# Patient Record
Sex: Female | Born: 1950 | Race: White | Hispanic: No | Marital: Single
Health system: Southern US, Community
[De-identification: ages and names within clinical notes are randomized; demographics above are authoritative.]

---

## 2005-08-04 ENCOUNTER — Ambulatory Visit: Payer: Self-pay | Admitting: Family Medicine

## 2005-10-08 ENCOUNTER — Emergency Department: Payer: Self-pay | Admitting: Emergency Medicine

## 2005-10-18 ENCOUNTER — Other Ambulatory Visit: Payer: Self-pay

## 2005-10-18 ENCOUNTER — Emergency Department: Payer: Self-pay | Admitting: Emergency Medicine

## 2005-12-13 ENCOUNTER — Emergency Department: Payer: Self-pay | Admitting: Emergency Medicine

## 2006-01-30 ENCOUNTER — Emergency Department: Payer: Self-pay | Admitting: Emergency Medicine

## 2006-01-30 ENCOUNTER — Other Ambulatory Visit: Payer: Self-pay

## 2006-02-22 ENCOUNTER — Emergency Department: Payer: Self-pay | Admitting: Emergency Medicine

## 2006-02-22 ENCOUNTER — Other Ambulatory Visit: Payer: Self-pay

## 2006-02-24 ENCOUNTER — Emergency Department: Payer: Self-pay | Admitting: Internal Medicine

## 2006-04-10 ENCOUNTER — Emergency Department: Payer: Self-pay | Admitting: Emergency Medicine

## 2006-04-10 ENCOUNTER — Other Ambulatory Visit: Payer: Self-pay

## 2006-04-15 ENCOUNTER — Ambulatory Visit: Payer: Self-pay

## 2006-04-25 ENCOUNTER — Emergency Department: Payer: Self-pay

## 2006-04-25 ENCOUNTER — Other Ambulatory Visit: Payer: Self-pay

## 2006-05-08 ENCOUNTER — Ambulatory Visit: Payer: Self-pay | Admitting: General Surgery

## 2006-07-09 ENCOUNTER — Ambulatory Visit: Payer: Self-pay | Admitting: Oncology

## 2006-08-09 ENCOUNTER — Other Ambulatory Visit: Payer: Self-pay

## 2006-08-09 ENCOUNTER — Emergency Department: Payer: Self-pay | Admitting: Emergency Medicine

## 2006-08-11 IMAGING — CR DG CHEST 1V PORT
1 series · 1 of 1 positions shown · non-contrast
Comparison: none

REASON FOR EXAM: chest pain
COMMENTS:

PROCEDURE:     DXR - DXR PORTABLE CHEST SINGLE VIEW  - April 10, 2006  [DATE]
RESULT:          Comparison is made to a prior study dated 10/18/2005.
The lungs are clear.  The cardiac silhouette and visualized bony skeleton
are unremarkable.

[view not recorded]
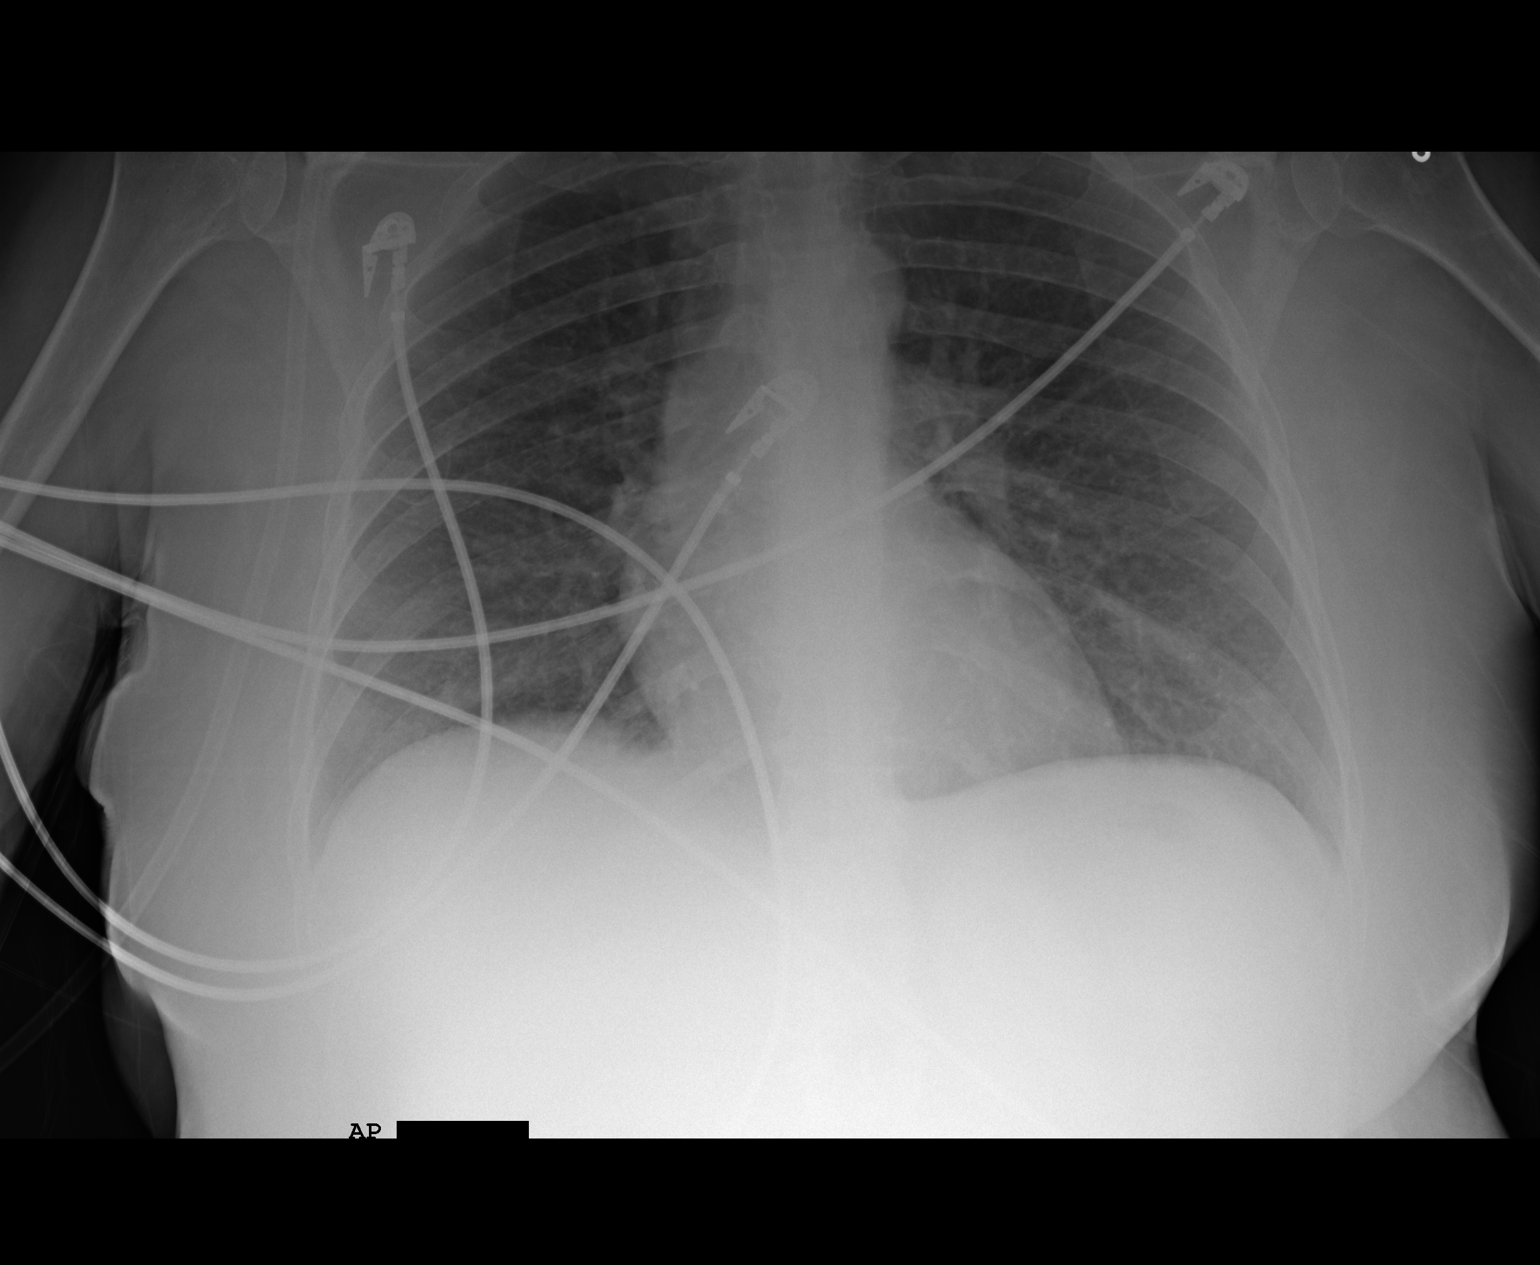

[1 of 1 positions shown; findings below may reference images not displayed]

IMPRESSION: Chest radiograph without evidence of acute cardiopulmonary
disease.

## 2006-08-21 ENCOUNTER — Emergency Department: Payer: Self-pay

## 2006-08-23 ENCOUNTER — Emergency Department: Payer: Self-pay | Admitting: Internal Medicine

## 2006-08-23 ENCOUNTER — Other Ambulatory Visit: Payer: Self-pay

## 2006-09-03 ENCOUNTER — Emergency Department: Payer: Self-pay | Admitting: Emergency Medicine

## 2006-11-11 ENCOUNTER — Ambulatory Visit: Payer: Self-pay

## 2007-03-20 ENCOUNTER — Emergency Department: Payer: Self-pay | Admitting: Unknown Physician Specialty

## 2007-06-07 ENCOUNTER — Ambulatory Visit: Payer: Self-pay | Admitting: General Surgery

## 2007-06-14 ENCOUNTER — Ambulatory Visit: Payer: Self-pay | Admitting: Pain Medicine

## 2007-06-22 ENCOUNTER — Ambulatory Visit: Payer: Self-pay | Admitting: Pain Medicine

## 2007-06-29 ENCOUNTER — Ambulatory Visit: Payer: Self-pay | Admitting: Pain Medicine

## 2007-07-21 IMAGING — CR DG CHEST 1V PORT
1 series · 1 of 1 positions shown · non-contrast
Comparison: none

REASON FOR EXAM: Shortness of breath
COMMENTS:

[view not recorded]
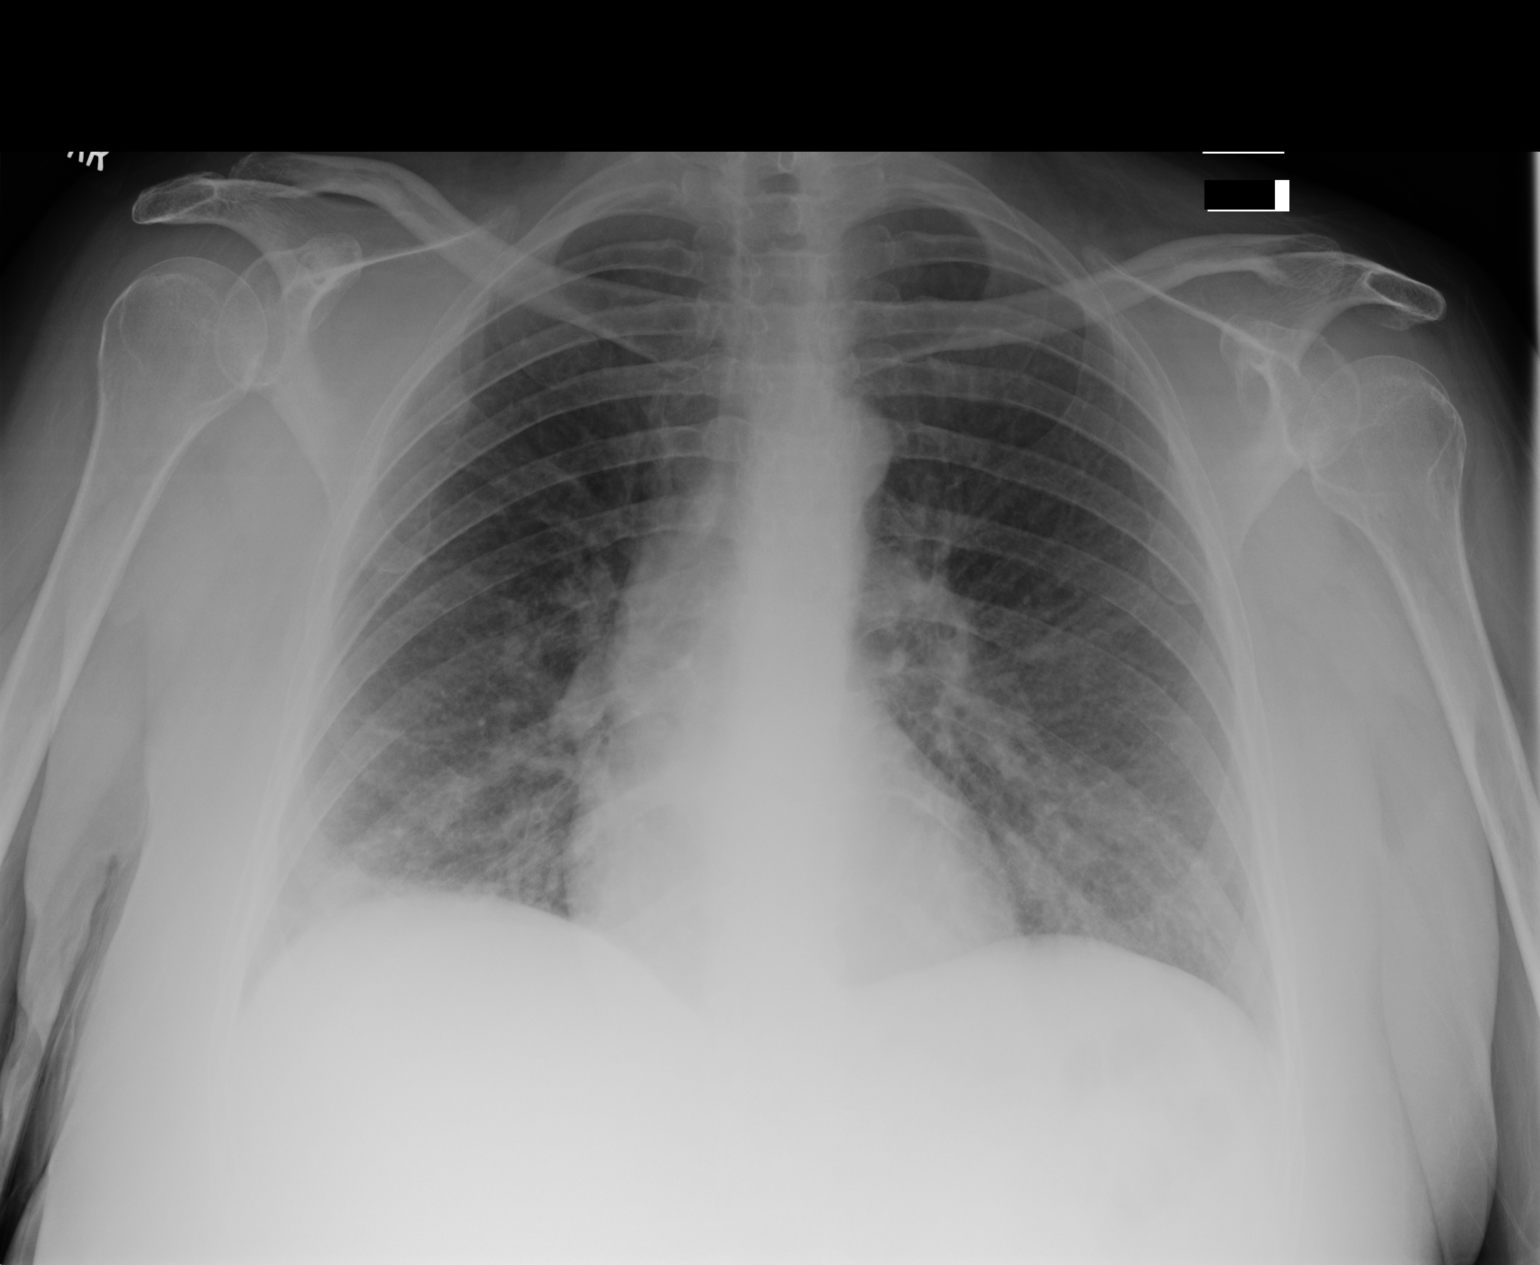

[1 of 1 positions shown; findings below may reference images not displayed]

PROCEDURE:     DXR - DXR PORTABLE CHEST SINGLE VIEW  - March 20, 2007  [DATE]

RESULT:     Frontal view of the chest is performed. Comparison is made to a
prior study dated 08/23/2006.

There is thickening of the interstitial markings as well as an element of
peribronchial cuffing. Areas of increased density project within the RIGHT
and LEFT lung bases. The cardiac silhouette is within normal limits. The
visualized bony skeleton demonstrates no evidence of fracture or
dislocation.
IMPRESSION: 1.     Bibasilar atelectasis versus infiltrate versus an element of
asymmetric edema.
2.     Findings which may represent the sequela of a diffuse interstitial
infiltrate, edematous versus non-edematous. Repeat surveillance evaluation
is recommended.

## 2007-10-05 ENCOUNTER — Emergency Department: Payer: Self-pay | Admitting: Emergency Medicine

## 2007-11-09 ENCOUNTER — Ambulatory Visit: Payer: Self-pay | Admitting: Internal Medicine

## 2008-09-11 ENCOUNTER — Ambulatory Visit: Payer: Self-pay | Admitting: Gastroenterology

## 2008-12-08 ENCOUNTER — Ambulatory Visit: Payer: Self-pay | Admitting: Internal Medicine

## 2010-02-05 ENCOUNTER — Emergency Department: Payer: Self-pay | Admitting: Internal Medicine

## 2010-02-25 ENCOUNTER — Emergency Department: Payer: Self-pay | Admitting: Emergency Medicine

## 2010-04-18 ENCOUNTER — Ambulatory Visit: Payer: Self-pay | Admitting: Urology

## 2010-05-14 ENCOUNTER — Ambulatory Visit: Payer: Self-pay | Admitting: Urology

## 2010-11-03 ENCOUNTER — Inpatient Hospital Stay: Payer: Self-pay | Admitting: Internal Medicine

## 2011-05-06 ENCOUNTER — Ambulatory Visit: Payer: Self-pay | Admitting: Emergency Medicine

## 2011-05-09 LAB — PATHOLOGY REPORT

## 2011-08-19 ENCOUNTER — Ambulatory Visit: Payer: Self-pay | Admitting: Emergency Medicine

## 2011-09-01 ENCOUNTER — Inpatient Hospital Stay: Payer: Self-pay | Admitting: Internal Medicine

## 2011-10-12 ENCOUNTER — Inpatient Hospital Stay: Payer: Self-pay | Admitting: Internal Medicine

## 2011-10-12 LAB — CK TOTAL AND CKMB (NOT AT ARMC)
CK, Total: 102 U/L (ref 21–215)
CK, Total: 100 U/L (ref 21–215)

## 2011-10-12 LAB — COMPREHENSIVE METABOLIC PANEL
Albumin: 2.8 g/dL — ABNORMAL LOW (ref 3.4–5.0)
Alkaline Phosphatase: 192 U/L — ABNORMAL HIGH (ref 50–136)
BUN: 26 mg/dL — ABNORMAL HIGH (ref 7–18)
Bilirubin,Total: 0.3 mg/dL (ref 0.2–1.0)
SGPT (ALT): 22 U/L
Total Protein: 7.1 g/dL (ref 6.4–8.2)

## 2011-10-12 LAB — CBC
HCT: 28.9 % — ABNORMAL LOW (ref 35.0–47.0)
HGB: 9.4 g/dL — ABNORMAL LOW (ref 12.0–16.0)
MCH: 29.4 pg (ref 26.0–34.0)
MCHC: 32.3 g/dL (ref 32.0–36.0)
MCV: 91 fL (ref 80–100)

## 2011-10-12 LAB — IRON AND TIBC
Iron Bind.Cap.(Total): 259 ug/dL (ref 250–450)
Iron Saturation: 11 %
Iron: 29 ug/dL — ABNORMAL LOW (ref 50–170)

## 2011-10-12 LAB — FERRITIN: Ferritin (ARMC): 1236 ng/mL — ABNORMAL HIGH (ref 8–388)

## 2011-10-12 LAB — RAPID INFLUENZA A&B ANTIGENS

## 2011-10-12 LAB — TROPONIN I: Troponin-I: 0.08 ng/mL — ABNORMAL HIGH

## 2011-10-13 LAB — URINALYSIS, COMPLETE
Glucose,UR: 150 mg/dL (ref 0–75)
Hyaline Cast: 27
Ketone: NEGATIVE
Nitrite: NEGATIVE
Protein: 100
RBC,UR: 23 /HPF (ref 0–5)
Specific Gravity: 1.014 (ref 1.003–1.030)
Squamous Epithelial: 2
WBC UR: 21 /HPF (ref 0–5)

## 2011-10-13 LAB — CBC WITH DIFFERENTIAL/PLATELET
Basophil %: 0.3 %
Eosinophil #: 0 10*3/uL (ref 0.0–0.7)
Eosinophil %: 0 %
HCT: 25.7 % — ABNORMAL LOW (ref 35.0–47.0)
HGB: 8.3 g/dL — ABNORMAL LOW (ref 12.0–16.0)
Lymphocyte #: 1 10*3/uL (ref 1.0–3.6)
Lymphocyte %: 14.2 %
MCH: 29.4 pg (ref 26.0–34.0)
MCHC: 32.4 g/dL (ref 32.0–36.0)
MCV: 91 fL (ref 80–100)
Monocyte #: 0.2 10*3/uL (ref 0.0–0.7)
Monocyte %: 2.7 %
Neutrophil #: 5.7 10*3/uL (ref 1.4–6.5)
Neutrophil %: 82.8 %
RBC: 2.83 10*6/uL — ABNORMAL LOW (ref 3.80–5.20)
RDW: 16.1 % — ABNORMAL HIGH (ref 11.5–14.5)

## 2011-10-13 LAB — LIPID PANEL
HDL Cholesterol: 31 mg/dL — ABNORMAL LOW (ref 40–60)
VLDL Cholesterol, Calc: 22 mg/dL (ref 5–40)

## 2011-10-13 LAB — CK TOTAL AND CKMB (NOT AT ARMC): CK-MB: 0.9 ng/mL (ref 0.5–3.6)

## 2011-10-13 LAB — CREATININE, SERUM
Creatinine: 2.18 mg/dL — ABNORMAL HIGH (ref 0.60–1.30)
EGFR (African American): 30 — ABNORMAL LOW

## 2011-10-13 LAB — MAGNESIUM: Magnesium: 1.7 mg/dL — ABNORMAL LOW

## 2011-10-13 LAB — TROPONIN I: Troponin-I: 0.09 ng/mL — ABNORMAL HIGH

## 2011-10-14 LAB — BASIC METABOLIC PANEL
Chloride: 93 mmol/L — ABNORMAL LOW (ref 98–107)
Co2: 31 mmol/L (ref 21–32)
Creatinine: 2.67 mg/dL — ABNORMAL HIGH (ref 0.60–1.30)
EGFR (Non-African Amer.): 19 — ABNORMAL LOW
Glucose: 343 mg/dL — ABNORMAL HIGH (ref 65–99)
Osmolality: 290 (ref 275–301)
Potassium: 5.3 mmol/L — ABNORMAL HIGH (ref 3.5–5.1)
Sodium: 133 mmol/L — ABNORMAL LOW (ref 136–145)

## 2011-10-14 LAB — CBC WITH DIFFERENTIAL/PLATELET
Basophil #: 0 10*3/uL (ref 0.0–0.1)
Basophil %: 0.3 %
Eosinophil %: 0.1 %
HCT: 25.8 % — ABNORMAL LOW (ref 35.0–47.0)
HGB: 8.4 g/dL — ABNORMAL LOW (ref 12.0–16.0)
Lymphocyte #: 1 10*3/uL (ref 1.0–3.6)
Lymphocyte %: 15.9 %
MCHC: 32.7 g/dL (ref 32.0–36.0)
MCV: 91 fL (ref 80–100)
Monocyte #: 0.2 10*3/uL (ref 0.0–0.7)
Monocyte %: 4.1 %
Neutrophil #: 4.8 10*3/uL (ref 1.4–6.5)
RBC: 2.84 10*6/uL — ABNORMAL LOW (ref 3.80–5.20)
RDW: 15.7 % — ABNORMAL HIGH (ref 11.5–14.5)
WBC: 6 10*3/uL (ref 3.6–11.0)

## 2011-10-14 LAB — MAGNESIUM: Magnesium: 2.3 mg/dL

## 2011-10-15 LAB — BASIC METABOLIC PANEL
Anion Gap: 10 (ref 7–16)
BUN: 53 mg/dL — ABNORMAL HIGH (ref 7–18)
Co2: 31 mmol/L (ref 21–32)
EGFR (African American): 24 — ABNORMAL LOW
Glucose: 117 mg/dL — ABNORMAL HIGH (ref 65–99)
Osmolality: 291 (ref 275–301)
Sodium: 138 mmol/L (ref 136–145)

## 2011-10-15 LAB — CBC WITH DIFFERENTIAL/PLATELET
Basophil #: 0 10*3/uL (ref 0.0–0.1)
Basophil %: 0.3 %
Eosinophil #: 0 10*3/uL (ref 0.0–0.7)
Eosinophil %: 0 %
HCT: 25.7 % — ABNORMAL LOW (ref 35.0–47.0)
HGB: 8.3 g/dL — ABNORMAL LOW (ref 12.0–16.0)
Lymphocyte %: 18.7 %
MCH: 29.4 pg (ref 26.0–34.0)
MCHC: 32.5 g/dL (ref 32.0–36.0)
MCV: 91 fL (ref 80–100)
RBC: 2.83 10*6/uL — ABNORMAL LOW (ref 3.80–5.20)
WBC: 7.3 10*3/uL (ref 3.6–11.0)

## 2011-10-16 LAB — CBC WITH DIFFERENTIAL/PLATELET
Basophil #: 0 10*3/uL (ref 0.0–0.1)
Basophil %: 0.2 %
Eosinophil %: 0 %
HCT: 26 % — ABNORMAL LOW (ref 35.0–47.0)
HGB: 8.5 g/dL — ABNORMAL LOW (ref 12.0–16.0)
Lymphocyte #: 0.8 10*3/uL — ABNORMAL LOW (ref 1.0–3.6)
MCHC: 32.6 g/dL (ref 32.0–36.0)
MCV: 90 fL (ref 80–100)
Monocyte #: 0.4 10*3/uL (ref 0.0–0.7)
Monocyte %: 4.8 %
Neutrophil #: 7.7 10*3/uL — ABNORMAL HIGH (ref 1.4–6.5)
Platelet: 177 10*3/uL (ref 150–440)
RBC: 2.87 10*6/uL — ABNORMAL LOW (ref 3.80–5.20)
WBC: 8.9 10*3/uL (ref 3.6–11.0)

## 2011-10-16 LAB — BASIC METABOLIC PANEL
BUN: 66 mg/dL — ABNORMAL HIGH (ref 7–18)
Calcium, Total: 8.1 mg/dL — ABNORMAL LOW (ref 8.5–10.1)
Co2: 31 mmol/L (ref 21–32)
EGFR (African American): 22 — ABNORMAL LOW
Osmolality: 284 (ref 275–301)
Sodium: 132 mmol/L — ABNORMAL LOW (ref 136–145)

## 2011-10-17 LAB — CBC WITH DIFFERENTIAL/PLATELET
Basophil #: 0 10*3/uL (ref 0.0–0.1)
Eosinophil #: 0 10*3/uL (ref 0.0–0.7)
HCT: 27.9 % — ABNORMAL LOW (ref 35.0–47.0)
Lymphocyte #: 1 10*3/uL (ref 1.0–3.6)
MCHC: 32.6 g/dL (ref 32.0–36.0)
MCV: 90 fL (ref 80–100)
Monocyte #: 0.2 10*3/uL (ref 0.0–0.7)
Monocyte %: 2.7 %
Neutrophil #: 7.3 10*3/uL — ABNORMAL HIGH (ref 1.4–6.5)
Platelet: 182 10*3/uL (ref 150–440)
RDW: 15.5 % — ABNORMAL HIGH (ref 11.5–14.5)
WBC: 8.5 10*3/uL (ref 3.6–11.0)

## 2011-10-17 LAB — BASIC METABOLIC PANEL
Anion Gap: 9 (ref 7–16)
BUN: 76 mg/dL — ABNORMAL HIGH (ref 7–18)
Chloride: 94 mmol/L — ABNORMAL LOW (ref 98–107)
Co2: 30 mmol/L (ref 21–32)
Creatinine: 2.89 mg/dL — ABNORMAL HIGH (ref 0.60–1.30)
Osmolality: 295 (ref 275–301)
Potassium: 5.4 mmol/L — ABNORMAL HIGH (ref 3.5–5.1)
Sodium: 133 mmol/L — ABNORMAL LOW (ref 136–145)

## 2011-10-18 LAB — BASIC METABOLIC PANEL
Anion Gap: 8 (ref 7–16)
BUN: 82 mg/dL — ABNORMAL HIGH (ref 7–18)
Calcium, Total: 8.2 mg/dL — ABNORMAL LOW (ref 8.5–10.1)
Chloride: 95 mmol/L — ABNORMAL LOW (ref 98–107)
Glucose: 160 mg/dL — ABNORMAL HIGH (ref 65–99)
Osmolality: 295 (ref 275–301)

## 2011-10-18 LAB — CBC WITH DIFFERENTIAL/PLATELET
Basophil #: 0 10*3/uL (ref 0.0–0.1)
Eosinophil #: 0 10*3/uL (ref 0.0–0.7)
HGB: 9.2 g/dL — ABNORMAL LOW (ref 12.0–16.0)
Lymphocyte %: 13.7 %
MCH: 29.3 pg (ref 26.0–34.0)
MCHC: 32.4 g/dL (ref 32.0–36.0)
MCV: 90 fL (ref 80–100)
Neutrophil #: 7.2 10*3/uL — ABNORMAL HIGH (ref 1.4–6.5)
Neutrophil %: 82.3 %
Platelet: 184 10*3/uL (ref 150–440)
RBC: 3.13 10*6/uL — ABNORMAL LOW (ref 3.80–5.20)
RDW: 15.3 % — ABNORMAL HIGH (ref 11.5–14.5)

## 2011-10-18 LAB — CULTURE, BLOOD (SINGLE)

## 2011-10-18 LAB — PROTIME-INR: Prothrombin Time: 14.4 secs (ref 11.5–14.7)

## 2011-10-18 LAB — VANCOMYCIN, TROUGH: Vancomycin, Trough: 23 ug/mL (ref 10–20)

## 2011-10-19 LAB — BASIC METABOLIC PANEL
Anion Gap: 9 (ref 7–16)
Calcium, Total: 8.4 mg/dL — ABNORMAL LOW (ref 8.5–10.1)
Chloride: 102 mmol/L (ref 98–107)
Co2: 30 mmol/L (ref 21–32)
Creatinine: 2.41 mg/dL — ABNORMAL HIGH (ref 0.60–1.30)
EGFR (African American): 27 — ABNORMAL LOW
Potassium: 5.4 mmol/L — ABNORMAL HIGH (ref 3.5–5.1)
Sodium: 141 mmol/L (ref 136–145)

## 2011-10-19 LAB — VANCOMYCIN, TROUGH: Vancomycin, Trough: 21 ug/mL (ref 10–20)

## 2011-10-20 ENCOUNTER — Inpatient Hospital Stay
Admission: AD | Admit: 2011-10-20 | Discharge: 2011-11-17 | Disposition: A | Discharge status: Skilled Nursing Facility | Payer: Self-pay | Source: Ambulatory Visit | Attending: Internal Medicine | Admitting: Internal Medicine

## 2011-10-20 LAB — BASIC METABOLIC PANEL
Calcium, Total: 8.7 mg/dL (ref 8.5–10.1)
Creatinine: 1.98 mg/dL — ABNORMAL HIGH (ref 0.60–1.30)
EGFR (African American): 33 — ABNORMAL LOW
EGFR (Non-African Amer.): 28 — ABNORMAL LOW
Glucose: 135 mg/dL — ABNORMAL HIGH (ref 65–99)
Osmolality: 308 (ref 275–301)
Potassium: 6 mmol/L — ABNORMAL HIGH (ref 3.5–5.1)
Sodium: 143 mmol/L (ref 136–145)

## 2011-10-21 ENCOUNTER — Other Ambulatory Visit (HOSPITAL_COMMUNITY): Payer: Self-pay

## 2011-10-21 ENCOUNTER — Other Ambulatory Visit: Payer: Self-pay

## 2011-10-21 LAB — PHOSPHORUS: Phosphorus: 3.6 mg/dL (ref 2.3–4.6)

## 2011-10-21 LAB — PRO B NATRIURETIC PEPTIDE: Pro B Natriuretic peptide (BNP): 8486 pg/mL — ABNORMAL HIGH (ref 0–125)

## 2011-10-21 LAB — DIFFERENTIAL
Basophils Absolute: 0 10*3/uL (ref 0.0–0.1)
Basophils Relative: 0 % (ref 0–1)
Eosinophils Relative: 0 % (ref 0–5)
Monocytes Absolute: 0.5 10*3/uL (ref 0.1–1.0)
Neutro Abs: 7.7 10*3/uL (ref 1.7–7.7)

## 2011-10-21 LAB — MAGNESIUM: Magnesium: 2.6 mg/dL — ABNORMAL HIGH (ref 1.5–2.5)

## 2011-10-24 ENCOUNTER — Other Ambulatory Visit (HOSPITAL_COMMUNITY): Payer: Self-pay

## 2011-10-25 LAB — ALBUMIN: Albumin: 2.4 g/dL — ABNORMAL LOW (ref 3.5–5.2)

## 2011-10-25 LAB — DIFFERENTIAL
Lymphocytes Relative: 22 % (ref 12–46)
Lymphs Abs: 2.7 10*3/uL (ref 0.7–4.0)
Monocytes Absolute: 0.9 10*3/uL (ref 0.1–1.0)
Monocytes Relative: 8 % (ref 3–12)
Neutro Abs: 8.3 10*3/uL — ABNORMAL HIGH (ref 1.7–7.7)

## 2011-10-25 LAB — GLUCOSE, RANDOM: Glucose, Bld: 436 mg/dL — ABNORMAL HIGH (ref 70–99)

## 2011-10-26 ENCOUNTER — Other Ambulatory Visit (HOSPITAL_COMMUNITY): Payer: Self-pay

## 2011-10-26 LAB — POTASSIUM
Potassium: 6.3 mEq/L (ref 3.5–5.1)
Potassium: 5.7 mEq/L — ABNORMAL HIGH (ref 3.5–5.1)

## 2011-10-27 ENCOUNTER — Other Ambulatory Visit (HOSPITAL_COMMUNITY): Payer: Self-pay

## 2011-10-27 LAB — BLOOD GAS, ARTERIAL
Bicarbonate: 30.6 mEq/L — ABNORMAL HIGH (ref 20.0–24.0)
FIO2: 0.35 %
O2 Saturation: 95.1 %
Patient temperature: 98.6
TCO2: 32.4 mmol/L (ref 0–100)
pCO2 arterial: 59 mmHg (ref 35.0–45.0)
pH, Arterial: 7.334 — ABNORMAL LOW (ref 7.350–7.400)
pO2, Arterial: 73.4 mmHg — ABNORMAL LOW (ref 80.0–100.0)

## 2011-10-27 LAB — BASIC METABOLIC PANEL
BUN: 57 mg/dL — ABNORMAL HIGH (ref 6–23)
CO2: 31 mEq/L (ref 19–32)
Chloride: 103 mEq/L (ref 96–112)
Chloride: 101 mEq/L (ref 96–112)
Creatinine, Ser: 1.33 mg/dL — ABNORMAL HIGH (ref 0.50–1.10)
Creatinine, Ser: 1.37 mg/dL — ABNORMAL HIGH (ref 0.50–1.10)
GFR calc Af Amer: 64 mL/min — ABNORMAL LOW (ref >90)
GFR calc non Af Amer: 55 mL/min — ABNORMAL LOW (ref >90)
Potassium: 4.7 mEq/L (ref 3.5–5.1)

## 2011-10-27 LAB — COMPREHENSIVE METABOLIC PANEL
AST: 37 U/L (ref 0–37)
Albumin: 2.4 g/dL — ABNORMAL LOW (ref 3.5–5.2)
Calcium: 9.2 mg/dL (ref 8.4–10.5)
Creatinine, Ser: 1.84 mg/dL — ABNORMAL HIGH (ref 0.50–1.10)
Total Protein: 6.2 g/dL (ref 6.0–8.3)

## 2011-10-27 LAB — IRON AND TIBC
Iron: 89 ug/dL (ref 42–135)
UIBC: 177 ug/dL (ref 125–400)

## 2011-10-27 LAB — CBC
HCT: 29.4 % — ABNORMAL LOW (ref 36.0–46.0)
HCT: 27.6 % — ABNORMAL LOW (ref 36.0–46.0)
HCT: 26.9 % — ABNORMAL LOW (ref 36.0–46.0)
Hemoglobin: 8.7 g/dL — ABNORMAL LOW (ref 12.0–15.0)
MCHC: 31.3 g/dL (ref 30.0–36.0)
MCV: 91.3 fL (ref 78.0–100.0)
RBC: 3.02 MIL/uL — ABNORMAL LOW (ref 3.87–5.11)
RDW: 15 % (ref 11.5–15.5)
RDW: 15.7 % — ABNORMAL HIGH (ref 11.5–15.5)
WBC: 12 10*3/uL — ABNORMAL HIGH (ref 4.0–10.5)
WBC: 15.3 10*3/uL — ABNORMAL HIGH (ref 4.0–10.5)

## 2011-10-27 LAB — TSH: TSH: 9.03 u[IU]/mL — ABNORMAL HIGH (ref 0.350–4.500)

## 2011-10-27 LAB — HEMOGLOBIN A1C: Mean Plasma Glucose: 166 mg/dL — ABNORMAL HIGH (ref <117)

## 2011-10-27 MED ORDER — IOHEXOL 350 MG/ML SOLN
100.0000 mL | Freq: Once | INTRAVENOUS | Status: AC | PRN
  Administered 2011-10-27: 100 mL via INTRAVENOUS

## 2011-10-27 MED ORDER — IOHEXOL 300 MG/ML  SOLN
20.0000 mL | INTRAMUSCULAR | Status: AC
  Administered 2011-10-27: 20 mL via ORAL

## 2011-10-28 LAB — CBC
MCH: 29.2 pg (ref 26.0–34.0)
MCHC: 31.2 g/dL (ref 30.0–36.0)
MCV: 93.4 fL (ref 78.0–100.0)
Platelets: 137 10*3/uL — ABNORMAL LOW (ref 150–400)
RBC: 2.71 MIL/uL — ABNORMAL LOW (ref 3.87–5.11)
RDW: 15.9 % — ABNORMAL HIGH (ref 11.5–15.5)

## 2011-10-28 LAB — IRON AND TIBC
Iron: 39 ug/dL — ABNORMAL LOW (ref 42–135)
Saturation Ratios: 13 % — ABNORMAL LOW (ref 20–55)
TIBC: 302 ug/dL (ref 250–470)
UIBC: 263 ug/dL (ref 125–400)

## 2011-10-28 LAB — BASIC METABOLIC PANEL
BUN: 74 mg/dL — ABNORMAL HIGH (ref 6–23)
Chloride: 101 mEq/L (ref 96–112)
GFR calc Af Amer: 46 mL/min — ABNORMAL LOW (ref >90)
GFR calc non Af Amer: 40 mL/min — ABNORMAL LOW (ref >90)
Potassium: 4.3 mEq/L (ref 3.5–5.1)
Sodium: 141 mEq/L (ref 135–145)

## 2011-10-29 ENCOUNTER — Other Ambulatory Visit (HOSPITAL_COMMUNITY): Payer: Self-pay

## 2011-10-29 LAB — CBC
MCHC: 30.9 g/dL (ref 30.0–36.0)
Platelets: 128 10*3/uL — ABNORMAL LOW (ref 150–400)
RDW: 15.8 % — ABNORMAL HIGH (ref 11.5–15.5)
WBC: 12 10*3/uL — ABNORMAL HIGH (ref 4.0–10.5)

## 2011-10-30 LAB — TYPE AND SCREEN
ABO/RH(D): O NEG
Unit division: 0

## 2011-10-30 LAB — CBC
HCT: 31.1 % — ABNORMAL LOW (ref 36.0–46.0)
Hemoglobin: 10.2 g/dL — ABNORMAL LOW (ref 12.0–15.0)
MCH: 29.8 pg (ref 26.0–34.0)
MCHC: 32.8 g/dL (ref 30.0–36.0)
MCV: 90.9 fL (ref 78.0–100.0)

## 2011-10-30 LAB — BASIC METABOLIC PANEL
BUN: 87 mg/dL — ABNORMAL HIGH (ref 6–23)
Chloride: 98 mEq/L (ref 96–112)
Glucose, Bld: 139 mg/dL — ABNORMAL HIGH (ref 70–99)
Potassium: 5 mEq/L (ref 3.5–5.1)

## 2011-10-30 LAB — PREPARE FRESH FROZEN PLASMA: Unit division: 0

## 2011-10-31 LAB — BASIC METABOLIC PANEL
BUN: 84 mg/dL — ABNORMAL HIGH (ref 6–23)
Calcium: 9.2 mg/dL (ref 8.4–10.5)
Creatinine, Ser: 1.95 mg/dL — ABNORMAL HIGH (ref 0.50–1.10)
GFR calc non Af Amer: 27 mL/min — ABNORMAL LOW (ref >90)
Glucose, Bld: 233 mg/dL — ABNORMAL HIGH (ref 70–99)

## 2011-11-01 LAB — BASIC METABOLIC PANEL
CO2: 35 mEq/L — ABNORMAL HIGH (ref 19–32)
Calcium: 9.3 mg/dL (ref 8.4–10.5)
Creatinine, Ser: 1.89 mg/dL — ABNORMAL HIGH (ref 0.50–1.10)
Glucose, Bld: 233 mg/dL — ABNORMAL HIGH (ref 70–99)
Sodium: 140 mEq/L (ref 135–145)

## 2011-11-01 LAB — CBC
MCH: 29.1 pg (ref 26.0–34.0)
MCV: 91.7 fL (ref 78.0–100.0)
Platelets: 130 10*3/uL — ABNORMAL LOW (ref 150–400)
RBC: 3.37 MIL/uL — ABNORMAL LOW (ref 3.87–5.11)

## 2011-11-02 LAB — BASIC METABOLIC PANEL
CO2: 36 mEq/L — ABNORMAL HIGH (ref 19–32)
Calcium: 9.1 mg/dL (ref 8.4–10.5)
Creatinine, Ser: 1.91 mg/dL — ABNORMAL HIGH (ref 0.50–1.10)

## 2011-11-03 LAB — BASIC METABOLIC PANEL
BUN: 77 mg/dL — ABNORMAL HIGH (ref 6–23)
Calcium: 9.2 mg/dL (ref 8.4–10.5)
Creatinine, Ser: 1.67 mg/dL — ABNORMAL HIGH (ref 0.50–1.10)
GFR calc non Af Amer: 33 mL/min — ABNORMAL LOW (ref >90)
Glucose, Bld: 55 mg/dL — ABNORMAL LOW (ref 70–99)
Sodium: 139 mEq/L (ref 135–145)

## 2011-11-03 LAB — CBC
MCH: 29.6 pg (ref 26.0–34.0)
MCHC: 31.3 g/dL (ref 30.0–36.0)
Platelets: 122 10*3/uL — ABNORMAL LOW (ref 150–400)
RDW: 15.5 % (ref 11.5–15.5)

## 2011-11-03 LAB — PROTIME-INR: Prothrombin Time: 14.6 seconds (ref 11.6–15.2)

## 2011-11-04 LAB — BASIC METABOLIC PANEL
BUN: 65 mg/dL — ABNORMAL HIGH (ref 6–23)
Creatinine, Ser: 1.46 mg/dL — ABNORMAL HIGH (ref 0.50–1.10)
GFR calc Af Amer: 45 mL/min — ABNORMAL LOW (ref >90)
GFR calc non Af Amer: 39 mL/min — ABNORMAL LOW (ref >90)

## 2011-11-05 LAB — BASIC METABOLIC PANEL
BUN: 62 mg/dL — ABNORMAL HIGH (ref 6–23)
Calcium: 9.1 mg/dL (ref 8.4–10.5)
GFR calc Af Amer: 41 mL/min — ABNORMAL LOW (ref >90)
GFR calc non Af Amer: 36 mL/min — ABNORMAL LOW (ref >90)
Potassium: 5.1 mEq/L (ref 3.5–5.1)
Sodium: 141 mEq/L (ref 135–145)

## 2011-11-05 LAB — CBC
MCH: 29 pg (ref 26.0–34.0)
MCHC: 30.1 g/dL (ref 30.0–36.0)
RDW: 15.8 % — ABNORMAL HIGH (ref 11.5–15.5)

## 2011-11-08 ENCOUNTER — Other Ambulatory Visit (HOSPITAL_COMMUNITY): Payer: Self-pay

## 2011-11-08 LAB — CBC
HCT: 29.8 % — ABNORMAL LOW (ref 36.0–46.0)
MCH: 28.9 pg (ref 26.0–34.0)
MCHC: 31.9 g/dL (ref 30.0–36.0)
RDW: 15.2 % (ref 11.5–15.5)

## 2011-11-08 LAB — BLOOD GAS, ARTERIAL
O2 Saturation: 88.8 %
Patient temperature: 98.6
TCO2: 34.7 mmol/L (ref 0–100)
pH, Arterial: 7.361 (ref 7.350–7.400)

## 2011-11-09 LAB — BLOOD GAS, ARTERIAL
Acid-Base Excess: 7.8 mmol/L — ABNORMAL HIGH (ref 0.0–2.0)
Bicarbonate: 32.7 mEq/L — ABNORMAL HIGH (ref 20.0–24.0)
Inspiratory PAP: 10
O2 Saturation: 95 %
TCO2: 34.3 mmol/L (ref 0–100)
pCO2 arterial: 53.4 mmHg — ABNORMAL HIGH (ref 35.0–45.0)
pO2, Arterial: 70.4 mmHg — ABNORMAL LOW (ref 80.0–100.0)

## 2011-11-09 LAB — CBC
HCT: 27.9 % — ABNORMAL LOW (ref 36.0–46.0)
MCV: 91.8 fL (ref 78.0–100.0)
RBC: 3.04 MIL/uL — ABNORMAL LOW (ref 3.87–5.11)
WBC: 6.6 10*3/uL (ref 4.0–10.5)

## 2011-11-09 LAB — BASIC METABOLIC PANEL
BUN: 57 mg/dL — ABNORMAL HIGH (ref 6–23)
Calcium: 8.7 mg/dL (ref 8.4–10.5)
Creatinine, Ser: 1.8 mg/dL — ABNORMAL HIGH (ref 0.50–1.10)
GFR calc Af Amer: 35 mL/min — ABNORMAL LOW (ref >90)
GFR calc non Af Amer: 30 mL/min — ABNORMAL LOW (ref >90)
Glucose, Bld: 238 mg/dL — ABNORMAL HIGH (ref 70–99)
Potassium: 5.2 mEq/L — ABNORMAL HIGH (ref 3.5–5.1)

## 2011-11-10 LAB — COMPREHENSIVE METABOLIC PANEL
ALT: 29 U/L (ref 0–35)
CO2: 33 mEq/L — ABNORMAL HIGH (ref 19–32)
Calcium: 9.1 mg/dL (ref 8.4–10.5)
Chloride: 95 mEq/L — ABNORMAL LOW (ref 96–112)
Creatinine, Ser: 1.92 mg/dL — ABNORMAL HIGH (ref 0.50–1.10)
GFR calc Af Amer: 32 mL/min — ABNORMAL LOW (ref >90)
GFR calc non Af Amer: 28 mL/min — ABNORMAL LOW (ref >90)
Glucose, Bld: 442 mg/dL — ABNORMAL HIGH (ref 70–99)
Sodium: 135 mEq/L (ref 135–145)
Total Bilirubin: 0.2 mg/dL — ABNORMAL LOW (ref 0.3–1.2)

## 2011-11-10 LAB — VANCOMYCIN, RANDOM: Vancomycin Rm: 8.3 ug/mL

## 2011-11-11 ENCOUNTER — Other Ambulatory Visit (HOSPITAL_COMMUNITY): Payer: Self-pay

## 2011-11-11 LAB — BASIC METABOLIC PANEL
BUN: 65 mg/dL — ABNORMAL HIGH (ref 6–23)
CO2: 33 mEq/L — ABNORMAL HIGH (ref 19–32)
Calcium: 9.3 mg/dL (ref 8.4–10.5)
Chloride: 101 mEq/L (ref 96–112)
Creatinine, Ser: 1.77 mg/dL — ABNORMAL HIGH (ref 0.50–1.10)
Glucose, Bld: 199 mg/dL — ABNORMAL HIGH (ref 70–99)

## 2011-11-11 LAB — DIFFERENTIAL
Basophils Absolute: 0 10*3/uL (ref 0.0–0.1)
Basophils Relative: 0 % (ref 0–1)
Eosinophils Absolute: 0 10*3/uL (ref 0.0–0.7)
Eosinophils Relative: 0 % (ref 0–5)
Monocytes Absolute: 0.5 10*3/uL (ref 0.1–1.0)
Monocytes Relative: 7 % (ref 3–12)
Neutro Abs: 5.7 10*3/uL (ref 1.7–7.7)

## 2011-11-11 LAB — PROCALCITONIN: Procalcitonin: 0.98 ng/mL

## 2011-11-11 LAB — CBC
HCT: 27.9 % — ABNORMAL LOW (ref 36.0–46.0)
Hemoglobin: 8.9 g/dL — ABNORMAL LOW (ref 12.0–15.0)
MCH: 29.2 pg (ref 26.0–34.0)
MCHC: 31.9 g/dL (ref 30.0–36.0)
RDW: 15.3 % (ref 11.5–15.5)

## 2011-11-11 LAB — PRO B NATRIURETIC PEPTIDE: Pro B Natriuretic peptide (BNP): 7819 pg/mL — ABNORMAL HIGH (ref 0–125)

## 2011-11-11 NOTE — Progress Notes (Signed)
  Echocardiogram 2D Echocardiogram has been performed.  Jorje Guild [facility] 11/11/2011, 4:42 PM

## 2011-11-12 LAB — BASIC METABOLIC PANEL
BUN: 62 mg/dL — ABNORMAL HIGH (ref 6–23)
CO2: 34 mEq/L — ABNORMAL HIGH (ref 19–32)
Calcium: 9 mg/dL (ref 8.4–10.5)
Chloride: 97 mEq/L (ref 96–112)
Creatinine, Ser: 1.79 mg/dL — ABNORMAL HIGH (ref 0.50–1.10)
Glucose, Bld: 271 mg/dL — ABNORMAL HIGH (ref 70–99)

## 2011-11-13 LAB — BASIC METABOLIC PANEL
BUN: 61 mg/dL — ABNORMAL HIGH (ref 6–23)
CO2: 38 mEq/L — ABNORMAL HIGH (ref 19–32)
Calcium: 9.2 mg/dL (ref 8.4–10.5)
GFR calc non Af Amer: 32 mL/min — ABNORMAL LOW (ref >90)
Glucose, Bld: 228 mg/dL — ABNORMAL HIGH (ref 70–99)
Sodium: 142 mEq/L (ref 135–145)

## 2011-11-14 LAB — BASIC METABOLIC PANEL
BUN: 56 mg/dL — ABNORMAL HIGH (ref 6–23)
CO2: 37 mEq/L — ABNORMAL HIGH (ref 19–32)
Calcium: 9.6 mg/dL (ref 8.4–10.5)
Chloride: 95 mEq/L — ABNORMAL LOW (ref 96–112)
Creatinine, Ser: 1.68 mg/dL — ABNORMAL HIGH (ref 0.50–1.10)
GFR calc Af Amer: 38 mL/min — ABNORMAL LOW (ref >90)
GFR calc non Af Amer: 33 mL/min — ABNORMAL LOW (ref >90)
Glucose, Bld: 138 mg/dL — ABNORMAL HIGH (ref 70–99)
Potassium: 4.4 mEq/L (ref 3.5–5.1)
Sodium: 139 mEq/L (ref 135–145)

## 2011-11-15 LAB — CULTURE, BLOOD (ROUTINE X 2)
Culture  Setup Time: 201302171110
Culture: NO GROWTH

## 2011-12-22 DEATH — deceased

## 2012-02-13 IMAGING — CR DG CHEST 1V PORT
1 series · 1 of 1 positions shown · non-contrast
Comparison: none

REASON FOR EXAM: intubation
COMMENTS:

[portable]
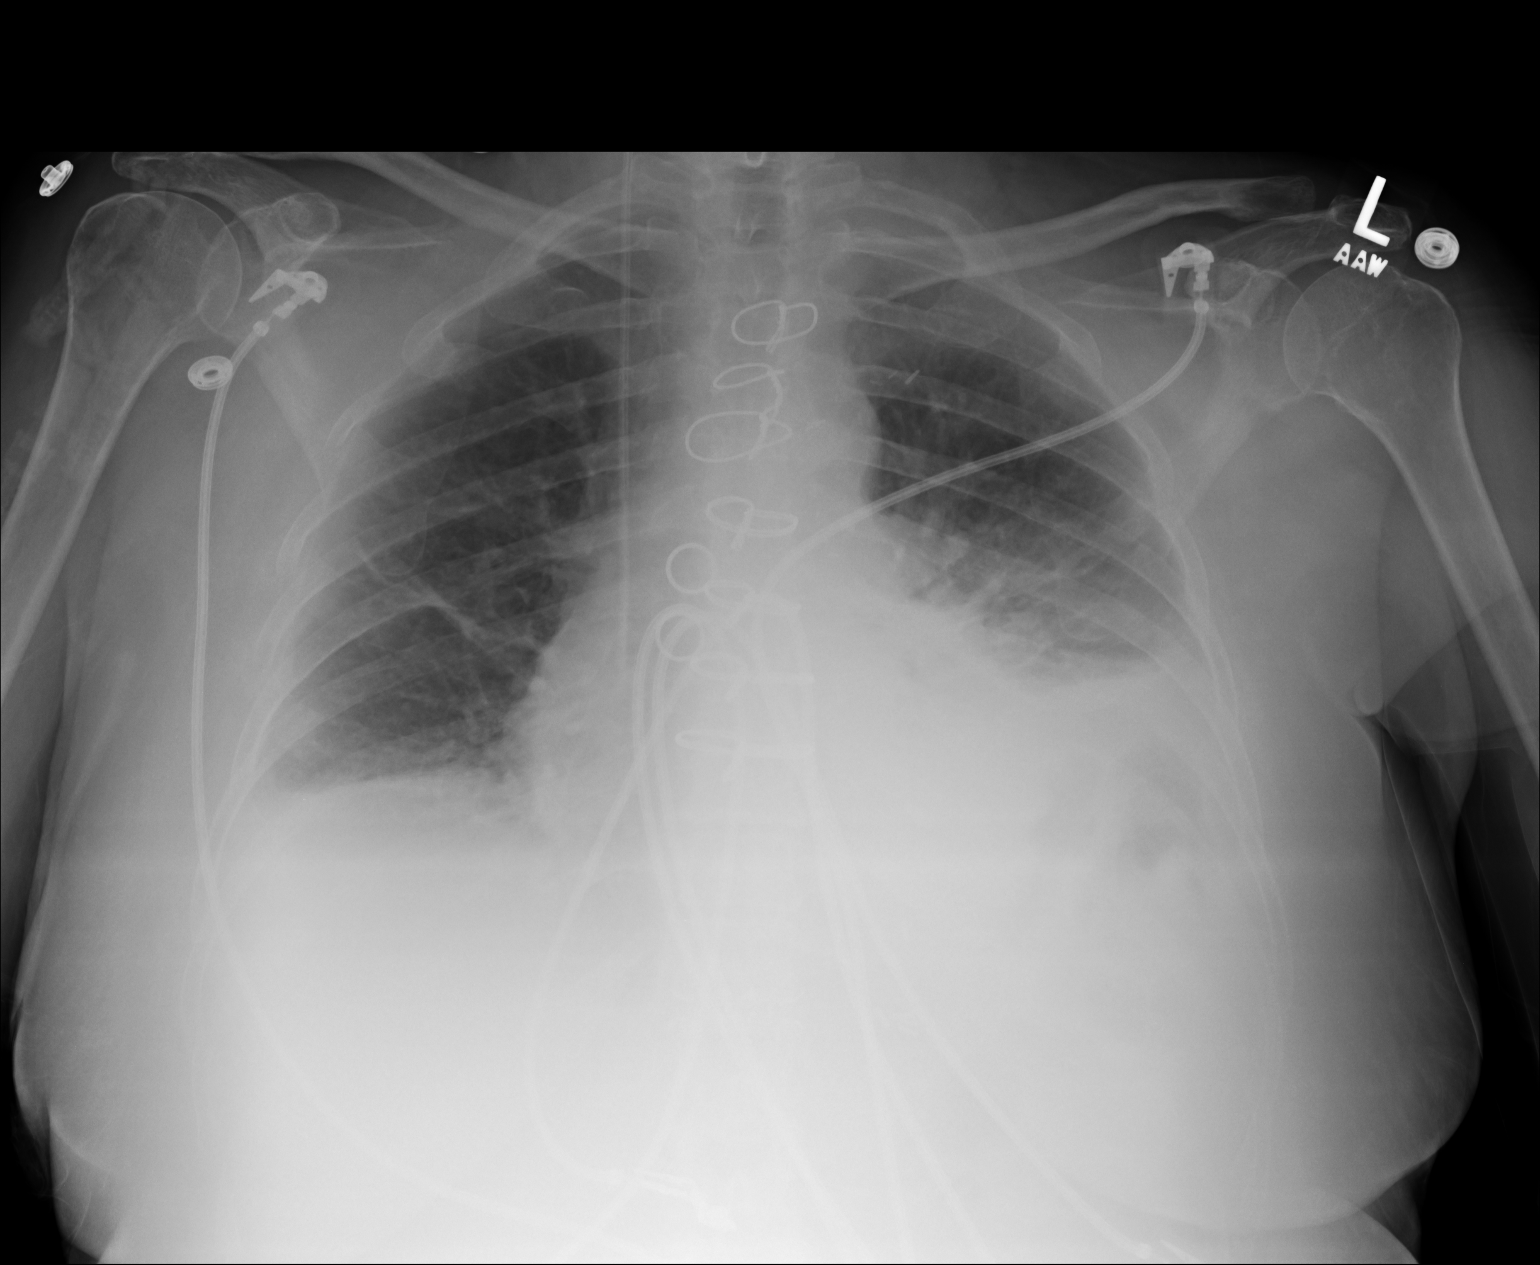

[1 of 1 positions shown; findings below may reference images not displayed]

PROCEDURE:     DXR - DXR PORTABLE CHEST SINGLE VIEW  - October 13, 2011  [DATE]

RESULT:

Frontal view of the chest is performed. Comparison is made to a prior study
of the same date, earlier time.

The patient is status post interval placement of a right internal jugular
central venous catheter. The tip projects in the region of the superior vena
cava/right atrial junction. The patient has otherwise taken a shallow
inspiration. Areas of increased density project within the lung bases, left
greater than right. There is blunting of the costophrenic angles. There is
mild prominence of the interstitial markings. The cardiac silhouette is
enlarged.
IMPRESSION: Interval placement of a right central venous catheter
without evidence of a pneumothorax. Note, this is a right sided catheter
placement. Bibasilar infiltrates and/or atelectasis as well as likely small,
bilateral effusions.

## 2012-02-13 IMAGING — US US PERC PLEURAL DRAIN W/INDWELL CATH W/IMG GUIDE
1 series · 2 of 2 positions shown · non-contrast
Comparison: none

REASON FOR EXAM: rule out pleural effusion
COMMENTS:

PROCEDURE:     US  - US PLEURAL EFFUSION LEFT  - October 13, 2011  [DATE]
RESULT:
Limited ultrasound evaluation of left hemithorax to evaluate for pleural
effusion.

[Series 1: us perc pleural drain w/indwell cath w/img guide · 2 of 2 slices shown]
[im 1/2]
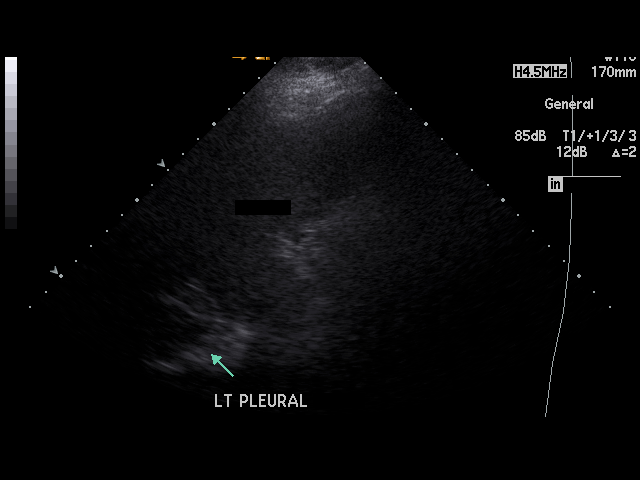
[im 2/2]
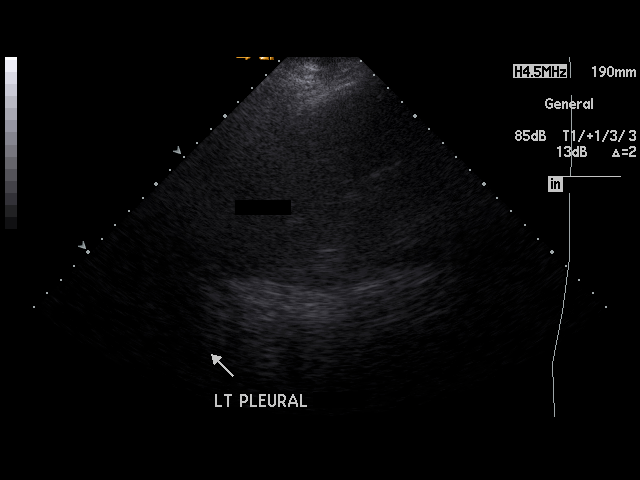

[2 of 2 positions shown; findings below may reference images not displayed]

FINDINGS: A trace amount of fluid is identified within the pleural space.
This does not appear to be amenable to ultrasound guided thoracentesis.
IMPRESSION: Trace amount of fluid within the pleural space.

## 2012-02-13 IMAGING — CR DG CHEST 1V PORT
1 series · 1 of 1 positions shown · non-contrast
Comparison: none

REASON FOR EXAM: resp distress
COMMENTS:

[ap]
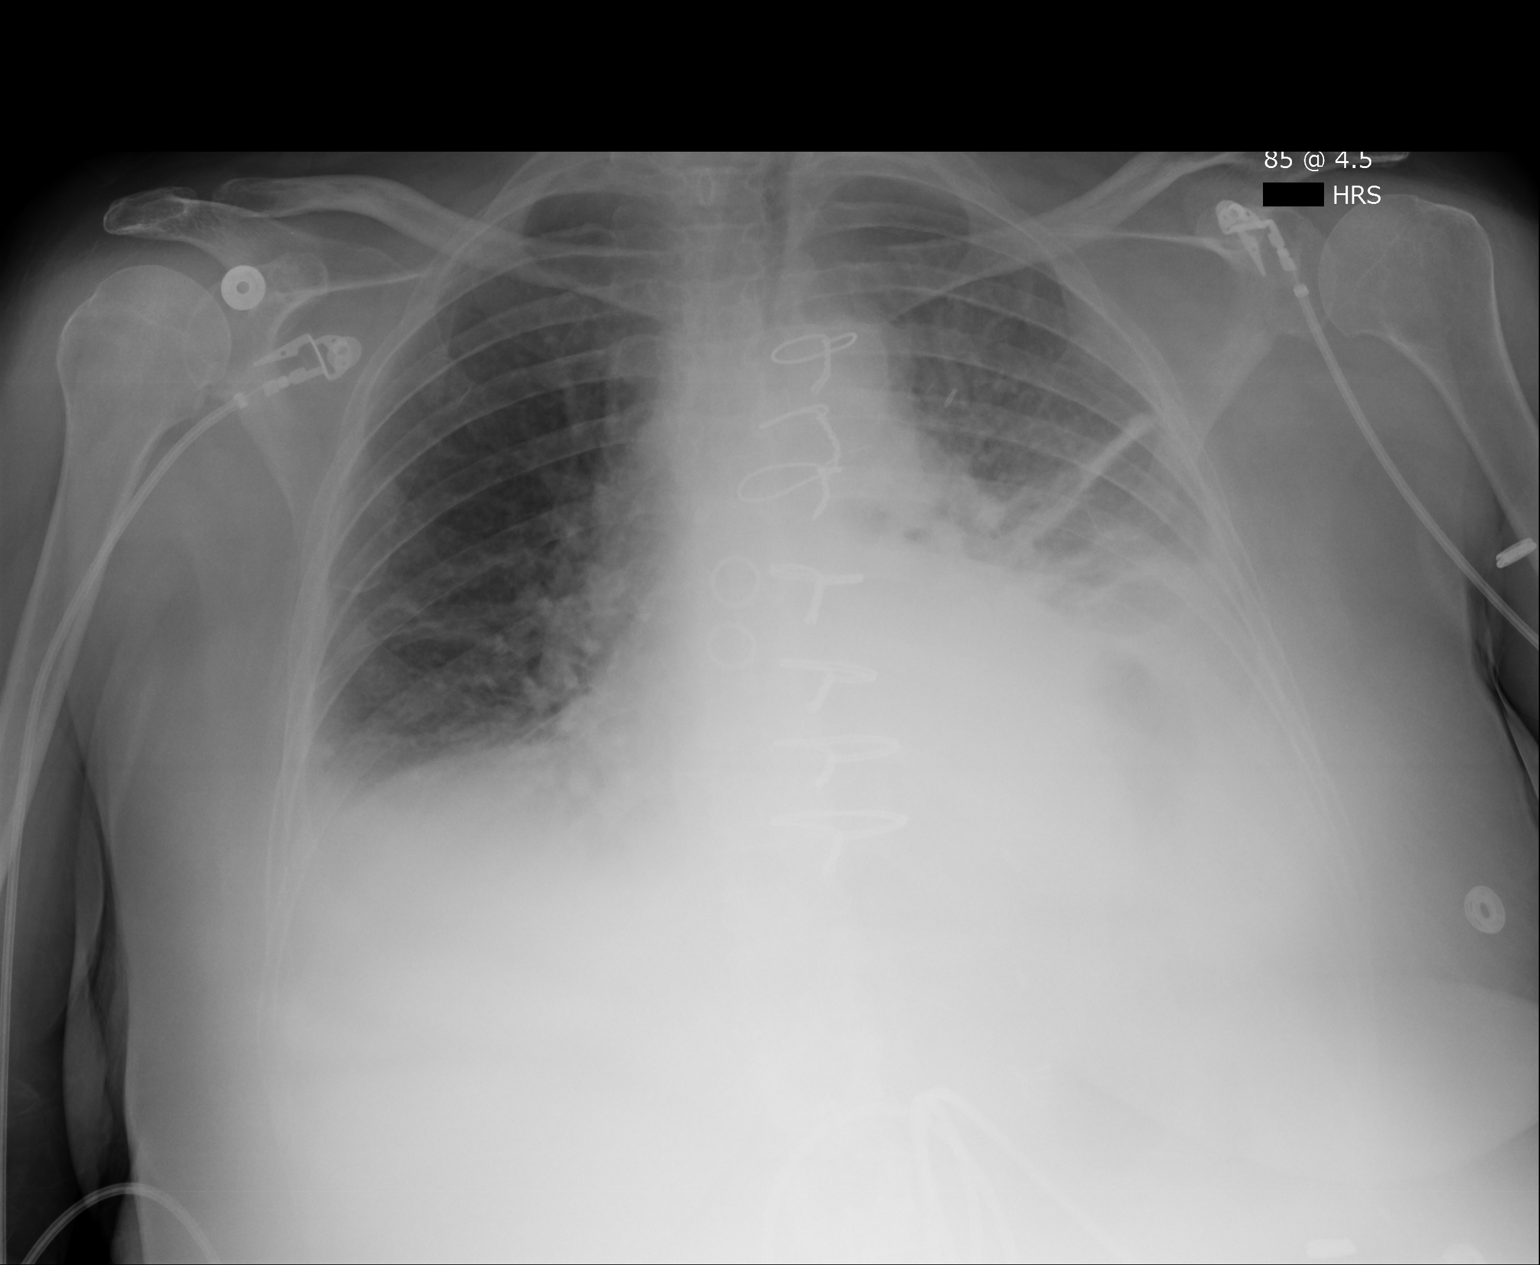

[1 of 1 positions shown; findings below may reference images not displayed]

PROCEDURE:     DXR - DXR PORTABLE CHEST SINGLE VIEW  - October 13, 2011  [DATE]

RESULT:

Frontal view of the chest is performed. Comparison is made to a prior study
dated 10/12/2011.

The patient has taken a shallow inspiration. There is thickening of the
interstitial markings and peribronchial cuffing. Area of increased density
projects in the region of the lingula. The cardiac silhouette is enlarged
indicative of cardiomegaly. The visualized bony skeleton is unremarkable.
The patient is status post median sternotomy and coronary artery bypass
grafting.

A small, left effusion is also identified.
IMPRESSION: 1.  Interstitial infiltrate likely representing pulmonary edema.
2.  Atelectasis versus focal area of airspace disease versus possibly
asymmetric edema, left lung base, as well as possible small, left effusion.
Continued surveillance evaluation is recommended.

## 2015-01-14 NOTE — Consult Note (Signed)
Chief Complaint and History:   Referring Physician Dr. Belia HemanKasa    Chief Complaint uncontrolled diabetes   Allergies:  Keflex: Resp. Distress, Swelling  Penicillin: Resp. Distress, Swelling  Naproxen Sodium: Unknown  Assessment/Plan:   Assessment/Plan This is a 64 yo F with diabetes who was admitted with SOB/hypoxia attributed to LLL pna and COPD exacerbation requiring antibiotics and steroids. Blood sugrs were high and she was treated with IV insulin. Sugars have been <180 un last few hours. She is ready for transition to SQ insulin. IV insulin rate is 1.3 - 1.4 units/hr. She is on SoluMedrol 60 mg q6hrs. Appetite is good and she tells me she is eating most of her meal trays.  A /  1. Uncontrolled diabetes 2. On steroids  P/ 1. Total daily insulin requirement currently 31.2 units. As steroid dosing is decreased, her insulin requirements will decrease. Will give Lantus 12 units SQ now plus NovoLog 4 units tid AC plus modified NovoLog SSI.  I will follow with you. Full consult has been dictated.    Case Discussed With patient   Electronic Signatures: Raj JanusSolum, Aanchal Cope M (MD)  (Signed 24-Jan-13 14:58)  Authored: Chief Complaint and History, ALLERGIES, Assessment/Plan   Last Updated: 24-Jan-13 14:58 by Raj JanusSolum, Dom Haverland M (MD)

## 2015-01-14 NOTE — Consult Note (Signed)
PATIENT NAME:  Leah Murray, Leah Murray MR#:  [id number] DATE OF BIRTH:  08/22/1951  DATE OF CONSULTATION:  10/16/2011  REFERRING PHYSICIAN:  Flora Lipps, MD CONSULTING PHYSICIAN:  A. Lavone Orn, MD  PRIMARY CARE PHYSICIAN: Lorelee Market, MD  CHIEF COMPLAINT: Uncontrolled diabetes.   HISTORY OF PRESENT ILLNESS: This is a 64 year old female with a history of diabetes who presented with shortness of breath and hypoxia to the emergency department on 10/12/2011 and was admitted. She has been diagnosed with chronic obstructive pulmonary disease exacerbation and pneumonia and has been treated with IV antibiotics and IV steroids, currently on Solu-Medrol 60 mg every 6 hours. Due to hyperglycemia, she was started on an IV insulin drip which has been continued. Sugars are now controlled and she is ready to be transitioned to subcutaneous insulin.  Over the last several hours her IV insulin drip rate has been in the 1.3 to 1.4 unit/hour range. Her last hemoglobin A1c on 10/13/2011 measured 7.1%.   She has had diabetes for 12 years. She has been taking insulin for at least five years. She recalls checking her blood sugars about five times per day. She complains of wide variability in blood sugars, with a range from the 40s to 300s. Her diabetes is complicated by retinopathy and she also has peripheral neuropathy. She has chronic kidney disease, stage III, which may be due to diabetic nephropathy. She reports good compliance with outpatient insulin dosing, which is Lantus 10 units at bedtime and NovoLog 5 units before meals. She does not take any oral antidiabetic medications.   PAST MEDICAL HISTORY:  1. Diabetes mellitus.  A. Diabetic retinopathy. B. Diabetic peripheral neuropathy.  2. Chronic kidney disease.  3. Bipolar disorder.  5. Hypertension.  5. Dyslipidemia.  6. Chronic obstructive pulmonary disease.  7. Obstructive sleep apnea.  8. Coronary artery disease.   PAST SURGICAL HISTORY:  1. Coronary  artery bypass graft.  2. Hysterectomy. CURRENT INPATIENT MEDICATIONS: 1. IV insulin.  2. Acyclovir 800 mg every 8 hours. 3. Neurontin 100 mg three times daily. 4. Solu-Medrol 60 mg every 6 hours.  5. Lopressor 25 mg twice a day. 6. Lyrica 50 mg twice a day. 7. Aspirin 325 mg daily.  8. Heparin 5000 units subcutaneous every eight hours.  9. Albuterol nebulizer.  10. Budesonide nebulizer.  11. Advair 250/50 one puff twice a day. 12. Spiriva 1 capsule daily.   SOCIAL HISTORY: She lives with her son, age 62. She quit smoking in December 2012. She denies use of alcohol.   FAMILY HISTORY: Mother is alive and well at age 86. Father had diabetes.  REVIEW OF SYSTEMS: GENERAL: No recent weight loss. No fevers. HEENT: No blurred vision. No sore throat. NECK: No neck pain. She does report occasional choking and dysphagia. CARDIAC: Denies chest pain or palpitations. PULMONARY: Has cough and shortness of breath. ABDOMEN: Denies abdominal pain. Appetite is good. No recent change in bowel habits. EXTREMITIES: Denies leg swelling. Denies any weakness. NEUROLOGIC: She reports numbness and tingling of the feet. No other sensory problems. ENDOCRINE: Denies heat or cold intolerance.   PHYSICAL EXAMINATION:   VITAL SIGNS: Temperature 95.8, pulse 80, respirations 20, blood pressure 147/76, pulse oximetry 93%.   GENERAL: Obese, white female in no acute distress.   HEENT: Extraocular movements are intact. Oropharynx is clear. Mucous membranes are moist.   NECK: Supple. No thyromegaly.   CARDIAC: Regular rate and rhythm. No carotid bruit.   PULMONARY: Coarse rhonchi bilaterally with decreased breath sounds throughout. Normal inspiratory  effort. No use of accessory muscles for respiration.   ABDOMEN: Diffusely soft, nontender, and nondistended. Positive bowel sounds.   EXTREMITIES: No edema is present.   SKIN: No rash or dermatopathy is present.   NEUROLOGIC: Nonfocal. She does have decreased  sensation on the feet.   PSYCHIATRIC: Alert and oriented x3, calm and cooperative.  LABS/STUDIES: Glucose 100, BUN 66, creatinine 2.82, sodium 132, potassium 4.9. EGFR 22. Calcium 8.1. Hemoglobin 8.5, hematocrit 26, WBC 8.9, and platelets 177.   ASSESSMENT: This is a 64 year old female who is severely ill with hypoxia due to chronic obstructive pulmonary disease exacerbation and pneumonia who also has outbreak of shingles who is currently requiring steroids which has worsened her underlying hyperglycemia due to diabetes.   PLAN:  1. She is ready for transition to subcutaneous insulin. Give Lantus 12 units now as well as NovoLog 4 units before meals and a modified insulin sliding scale.  2. A fixed carb diet has been ordered. I will modify that slightly to avoid any concentrated sweets.   Thank you for the kind request for consultation. I will follow along with you.  ____________________________ A. Lavone Orn, MD ams:slb D: 10/16/2011 15:44:49 ET T: 10/16/2011 16:57:09 ET JOB#: [id number]  cc: A. Lavone Orn, MD, <Dictator> Sherlon Handing MD ELECTRONICALLY SIGNED 10/17/2011 12:06

## 2015-01-14 NOTE — Discharge Summary (Signed)
PATIENT NAME:  Leah Murray, Leah Murray MR#:  [id number] DATE OF BIRTH:  1950/11/13  DATE OF ADMISSION:  10/12/2011 DATE OF DISCHARGE:  10/20/2011  This is an addendum to the interim discharge summary done by Dr. Hilda Lias which covered the events from 01/20 to 10/19/2011. This covers from 01/27 to 10/20/2011.   DISCHARGE DIAGNOSES:  1. Acute on chronic respiratory failure secondary to underlying chronic obstructive pulmonary disease with superimposed pneumonia.  2. History of congestive heart failure, chronic systolic dysfunction. 3. History of recent coronary artery bypass graft. 4. Fever secondary to pneumonia. 5. Uncontrolled diabetes. 6. Hyperlipidemia. 7. Diabetic neuropathy.  8. Acute on chronic renal failure. 9. Shingles.  TEST DURING HOSPITALIZATION: (Since 10/19/2011) On 10/19/2011 a chest x-ray showed markedly shallow inspiration, vascular crowding versus atelectasis of left lung base, discoid atelectasis of right lung base. No focal regions of consolidation are appreciated.   HOSPITAL COURSE: (Since Dr. Hilbert Odor interim summary)  1. Acute respiratory, most likely secondary to chronic obstructive pulmonary disease with underlying pneumonia: The patient was on Solu-Medrol. She will be weaned to oral prednisone in 1 to 2 days. Continue Spiriva and Advair. Pulmonary has been following. The patient has been weaned off of high flow oxygen to 3 liters oxygen. We continued empiric aztreonam and vancomycin for day 6 of 10. We will keep the central line to allow administration of this medication. 2. Congestive heart failure, likely chronic systolic dysfunction: Recent CABG. Creatinine improving. Urine output has been good. The patient was continued on beta blockers and we have been following daily weights and ins and outs.  3. Fever: Fever curve has improved We will continue aztreonam and vancomycin for 10 days. Blood culture showed no growth. The patient has a mid sternotomy scar that has some  drainage, but likely not the source of fever.  4. Diabetes: The patient was on sliding scale insulin, NovoLog three times daily, and Lantus. She has some elevations in her blood sugar, probably due to steroids. 5. Hyperlipidemia: We continued Crestor. 6. Diabetic neuropathy: We continued Lyrica. 7. Acute on chronic renal failure: Urine output has been good. We have been holding her Lasix for now and renal dosing her medications to avoid nephrotoxins.  8. Shingles: She is on day 6 of 10 of acyclovir. We continued Lidoderm, Lyrica, and Neurontin. 9. Hyperkalemia: This resolved with insulin and Kayexalate. 10. Deep vein thrombosis prophylaxis: She was maintain with heparin subcutaneous.   DISCHARGE MEDICATIONS: 1. Crestor 40 mg daily.  2. Aspirin 325 mg daily.  3. Xanax 1 mg p.o. every 8 hours p.r.n. anxiety.  4. Spiriva 1 puff daily. 5. Advair Diskus 1 puff twice a day. 6. Heparin 5000 subcutaneous every 8 hours. 7. Metoprolol 25 mg p.o. twice a day. 8. Nitroglycerin 0.2 mg topically daily.  9. Protonix 40 mg daily.  10. Lyrica 50 mg p.o. twice a day. 11. Nystatin powder to affected areas twice a day. 12. Tylenol 650 mg p.o. every 4 hours p.r.n. temperature. 13. Acyclovir 800 mg p.o. every 8 hours daily, day 6 of 10. 14. Aztreonam 0.5 mg IV every 6 hours, day 6 of 10.  15. Gabapentin 900 mg p.o. twice a day. 16. Lidoderm patch 0.5%, 12 hours on and 12 hours off. 17. Insulin aspartate 12 units subcutaneous three times daily. 18. Lantus 22 units at bedtime. 19. Vancomycin 1 gram IV every 48 hours, day 6 of 10. 20. Solu-Medrol 600 mg IV every 12 hours. Would switch to prednisone taper in 1 to 2 days.  21. NovoLog sliding scale insulin; 2 units for blood sugars 130-150, 4 units for blood sugars 151-200, 6 units for blood sugars 201-250, 8 units for blood sugars 251-300, 10 units for blood sugars 301-350, 12 units for blood sugars 351-400, 15 units for blood sugars 401-450, 20 units for blood  sugar 451-500, 25 units if blood sugars 501/550, 30 units if blood sugars 551-600.  OXYGEN: 3 liters nasal cannula.   DIET: Low fat, low-sodium, ADA diet.   ACTIVITY: With assistance. Physical therapy five times a week.   WOUND CARE: Cover bottom of sternotomy with sterile gauze daily. Thin hydrocolloid to reduce friction or shear. For Braden 15-18.   LABS/STUDIES: Glucose 135, BUN 71, creatinine 1.98, sodium 143, potassium 5.1, chloride 105, bicarbonate 30, calcium 8.7, anion gap 8.   DISCHARGE INSTRUCTIONS/FOLLOWUP: I would check a BNP in two days.   If the patient's kidney function improves, would resume Lasix, probably 20 mg p.o. twice a day.   Her baseline creatinine is probably around 1.4 to 1.3.   The patient should followup with Dr. Evelene CroonMeindert Niemeyer and Dr. Freda MunroSaadat Khan one week after discharge.  DISCHARGE PHYSICAL EXAMINATION:   VITAL SIGNS: Vital signs at the time of discharge - temperature 98, heart rate 97, respiratory rate 22, blood pressure 133/82, and saturating 98% on 3 liters.   LUNGS: Clear to auscultation with some scant wheezing.   CARDIOVASCULAR: Regular rate and rhythm. No murmurs, gallops, or rubs appreciated.   ABDOMEN: Benign.  CODE STATUS: FULL CODE.   DISPOSITION: LT[facility].        Thank you for allowing me to participate in the care of this patient.   TOTAL TIME SPENT ON DISCHARGE: 60 minutes. ____________________________ Corie ChiquitoAmir A. Lafayette DragonFirozvi, MD aaf:slb D: 10/20/2011 14:09:29 ET T: 10/20/2011 14:28:42 ET JOB#: [id number]  cc: Karolee OhsAmir A. [place] DragonFirozvi, MD, <Dictator> Yevonne PaxSaadat A. Khan, MD Meindert A. Lacie ScottsNiemeyer, MD Dory LarsenKurian D. Kasa, MD A. Wendall MolaMelissa Solum, MD Laurier NancyShaukat A. Khan, MD Karolee OhsAMIR Laverda PageA Rajendra Spiller MD ELECTRONICALLY SIGNED 10/21/2011 15:36

## 2015-01-14 NOTE — Consult Note (Signed)
PATIENT NAME:  Leah Murray, Leah Murray MR#:  [id number] DATE OF BIRTH:  [birth year]  DATE OF CONSULTATION:  10/13/2011  REFERRING PHYSICIAN:  Dr. Jacques Navy  CONSULTING PHYSICIAN:  Verta Ellen, PA-C  PRIMARY CARE PHYSICIAN: Dr. Lacie Scotts    REASON FOR CONSULTATION: Congestive heart failure, elevated troponin, patient status post recent coronary artery bypass grafting.   HISTORY OF PRESENT ILLNESS: Leah Murray is a 64 year old white female who is currently in the CCU on BiPAP and, therefore, history is extremely difficult to obtain. This history is taken basically from the hospitalist history per the chart. Leah Murray basically is a 64 year old white female as I mentioned above with a history of coronary artery disease status post coronary artery bypass grafting done at [facility] on 09/12/2011. She has a history of chronic obstructive pulmonary disease, asthma, hypertension, and diabetes mellitus. She had complained of increased shortness of breath and chest pain and was, therefore, brought to the Emergency Department. The patient also has been complaining of abdominal pain and diarrhea.   PAST MEDICAL HISTORY:  1. Diabetes. 2. Bipolar disorder.  3. Depression.  4. Hypertension.  5. Hyperlipidemia.  6. History of prior tobacco abuse.  7. Obstructive sleep apnea, not currently using any CPAP.  8. Asthma.  9. Chronic obstructive pulmonary disease.  10. Obesity.  11. History of hyperosmolar nonketotic hyperglycemia last year.  12. Coronary artery disease status post coronary artery bypass grafting 09/12/2011, three vessel bypass (details unknown).   PAST SURGICAL HISTORY:  1. Partial hysterectomy.  2. Coronary artery bypass grafting as mentioned above.  3. Multiple ear surgeries.  4. Left breast biopsy.   ALLERGIES: Keflex, naproxen, and penicillin.   HOME MEDICATIONS:  1. Crestor 40 mg 1 tablet p.o. daily.  2. Lantus 10 units injected subcutaneously daily.  3. Sliding  scale insulin.  4. Metoprolol 25 mg p.o. b.i.d.  5. Soma 350 mg p.o. 3 times daily.  6. Prilosec 20 mg p.o. daily.  7. Advair 250/50 mcg inhaled twice daily.  8. Albuterol as directed.  9. Spiriva 18 mcg 1 tab inhaled daily.  10. Potassium 10 mEq (unknown if takes once or twice daily).  11. Lyrica 50 mg p.o. twice daily.  12. Aspirin 325 mg p.o. daily.  13. Lasix 40 mg q. a.m. (patient is supposed to be taking this b.i.d.).Marland Kitchen 14. Xanax 1 mg p.o. t.i.d.  15. Oxygen 2 L/min.   FAMILY HISTORY (Obtained from chart): Father with myocardial infarction, stomach carcinoma.   SOCIAL HISTORY (taken per the chart): The patient lives in [place] with family. She has history of previous tobacco abuse and quit after recent hospital discharge in December of 2012. She has denied any alcohol or illicit drug use.   REVIEW OF SYSTEMS: Unable to be taken at this time since the patient is on BiPAP and very lethargic. On her admission here she had complained of chest pain, shortness of breath, abdominal pain, and diarrhea.   PHYSICAL EXAMINATION:   GENERAL: This is an ill appearing female on BiPAP.   VITAL SIGNS: Heart rate 88, respiratory rate 26, blood pressure 120/54, oxygen saturation 97 on BiPAP at 5 liters.   HEENT: Head atraumatic, normocephalic.   EYES: Pupils are round, equal bilaterally, reactive to light. Conjunctivae are pale pink. There is no scleral icterus. Ears and nose are normal to external inspection.   MOUTH: The patient has dry mouth and mucous membranes. BiPAP mask is not removed.   NECK: Supple. Trachea is midline. Thyroid is smooth  and mobile.   LUNGS: The patient has coarse breath sounds and difficult to hear over the BiPAP. However, she has some crackles anteriorly at the bases.   CARDIOVASCULAR: Regular rate and rhythm. No murmurs, rubs, or gallops.   ABDOMEN: Nondistended. Bowel sounds present in all four quadrants. She has some tenderness across the entire abdomen with  some localization to both the right and left lower quadrants.   EXTREMITIES: The patient has trace pedal edema bilaterally. Cool extremities.   ANCILLARY DATA: Review of telemetry shows normal sinus rhythm. No acute ST or T wave changes.   LABORATORY DATA: Iron 29. Glucose 382. B-type Natriuretic Peptide 24,085. BUN 26, creatinine 1.93. Folic acid 18.6. Sodium 137, potassium 4.5, chloride 99, CO2 28. TIBC 259. Unbound iron binding capacity 230. Iron sat 11. Estimated GFR 28. Osmolality 294. Ferritin 1236. Total protein 7.1, albumin 2.8, total bilirubin 0.3, alkaline phosphatase 192, AST 36, ALT 22. Total CK 84. CK-MB 0.9. Troponin-I 0.09. White blood cell count 6.9, red blood cell count 2.83, hemoglobin 8.3, hematocrit 25.7, platelet count 151,000. Influenza A and B antigens negative. Arterial blood gas pH 7.35, pCO2 55, pO2 78, FiO2 50, bicarb 30.4.   EKG on admission yesterday with sinus tachycardia, low voltage QRS, right bundle branch block, possible inferior infarct (cited on or before 08/09/2006).   Portable chest x-ray from January 21st interstitial infiltrate likely representing pulmonary edema. Atelectasis versus focal area of airspace disease versus possibility asymmetric edema, left lung base, as well as possible small left effusion.   ASSESSMENT/PLAN: Acute shortness of breath/respiratory distress. The patient is with known chronic obstructive pulmonary disease, coronary artery disease with [facility]. The patient has infiltrates and elevated BNP most consistent with congestive heart failure exacerbation. She was supposed to be taking Lasix 40 mg twice daily but has only been taking it once daily. Her elevated troponin-I are very mild and most likely from demand ischemia/acute respiratory failure. She denies any chest pain. She does not have any acute ST or T wave elevations to suggest acute coronary syndrome.  Her anemia may be contributing to her acute shortness of breath. We would continue diuretics and increase Lasix to 40 mg IV twice daily in addition to beta-blocker, nitrates, and supportive care. Due to hypomagnesemia, will replace. Monitor hemoglobin and hematocrit. Await echocardiogram results. [facility]   We will continue to follow this patient with you. Thank you very much for this consultation and allowing us to participate in this patient's care.   ____________________________ Verta EllenMonica A. Adaijah Endres, PA-C for Adrian BlackwaterShaukat Khan, MD mam:drc D: 10/13/2011 10:35:51 ET T: 10/13/2011 12:08:34 ET JOB#: [id number]  cc: Verta EllenMonica A. Xion Debruyne, PA-C, <Dictator> Meindert A. Lacie ScottsNiemeyer, MD Dreyden Rohrman A [facility] LL[facility]ANZI PA ELECTRONICALLY SIGNED 10/13/2011 13:22

## 2015-01-14 NOTE — H&P (Signed)
PATIENT NAME:  Leah Murray, Leah Murray MR#:  [id number] DATE OF BIRTH:  November 22, 1950  DATE OF ADMISSION:  10/12/2011  REFERRING PHYSICIAN: Dr. Darnelle Catalan.   PRIMARY CARE PHYSICIAN: Dr. Lacie Scotts.   CHIEF COMPLAINT: Shortness of breath.   HISTORY OF PRESENT ILLNESS: The patient is a 64 year old Caucasian female with history of coronary artery disease status post coronary artery bypass graft at [facility] last month, chronic obstructive pulmonary disease, asthma, hypertension, who presents with shortness of breath and chest pain. The patient was recently admitted here on 12/10 during which she was intubated in the ER, had complications from intubation as well as acute renal failure and elevated troponins and needed transfer to [facility]. The patient was in [facility] for three weeks per the patient. She had a coronary artery bypass graft on the 21st of December per the patient. She was released and currently lives with her family members. Her symptoms of shortness of breath, chest pains without radiation and dizziness started about two days ago. She ran out of her oxygen about that time. She has cough with whitish-yellow sputum. She feels like she has also gained some weight. She has chronic swelling in the legs. She has no fevers. She also endorses some diarrhea for several days without any abdominal pain. She was seen by her cardiac surgeon's office last week and the staples were removed at that time. She has been taking her medications. However, does not know the dosages and most of her medications and appears to be somewhat unreliable. On arrival here, she was somewhat hypoxic with oxygen sats in the low 90s. However, when I did see her in her room, her 02 sats were in the low 80s on the 2 liters. She also has elevated troponin of 0.08. Hospitalist services were contacted for further evaluation and management.   PAST MEDICAL HISTORY:  1. Diabetes. 2. Bipolar. 3. Depression. 4. Hypertension. 5. Dyslipidemia. 6. History of prior  tobacco abuse. 7. Obstructive sleep apnea, not on CPAP. 8. Asthma. 9. Chronic obstructive pulmonary disease. 10. Obesity. 11. History of hyperosmolar nonketotic hyperglycemia last year. 12. Coronary artery disease status post coronary artery bypass graft in 08/2009 which was three-vessel.   PAST SURGICAL HISTORY:  1. Partial hysterectomy. 2. Coronary artery bypass graft.  3. Multiple ear surgeries. 4. Left breast biopsy.   MEDICATIONS:  1. Crestor 40 mg daily.  2. Lantus 10 units daily. 3. Sliding scale insulin.  4. Metoprolol 25 mg twice daily. 5. Soma 350 mg 3 times daily.   6. Prilosec 20 mg daily.  7. Advair 250/50 micrograms twice daily.  8. Albuterol as needed.  9. Spiriva 18 mcg one cap inhaled daily.  10. Potassium 10 milliequivalents.  11. Lyrica 50 mg twice daily.  12. Aspirin 325 mg daily.  13. Lasix 40 mg q.a.m. the patient takes, however, she is supposed to be on b.i.d.  14. Xanax 1 mg t.i.d.  15. Oxygen 2 liters.   The above medications were derived by calling the patient's daughter.   ALLERGIES: Keflex, naproxen and penicillin.   FAMILY HISTORY: Dad with myocardial infarction and cancer of the stomach.   SOCIAL HISTORY: Lives in [place] with family currently. Previous tobacco abuse, however, quit after her recent discharge. No alcohol or drug use.   REVIEW OF SYSTEMS: CONSTITUTIONAL: No fever. There is possible weight gain. EYES: No blurry vision or double vision. ENT: No tinnitus or hearing loss, has a history of obstructive sleep apnea. RESPIRATORY: Cough as above. No wheezing. No hemoptysis. She has  asthma and chronic obstructive pulmonary disease. CARDIOVASCULAR: Chest pain. Also has orthopnea and edema. She has some dyspnea on exertion as well. GASTROINTESTINAL: Some diarrhea. No nausea or vomiting. No abdominal pain. No rectal bleeding or melena. No constipation. GU: No dysuria or hematuria or frequency. ENDOCRINE: No polyuria or nocturia. No thyroid  problems. HEME/LYMPH: Has a history of anemia. No easy bruising or bleeding. SKIN: No new rashes. MUSCULOSKELETAL: Some arthritis. NEUROLOGIC: Some overall weakness, nonfocal. No cerebrovascular accident or transient ischemic attack. PSYCH: Has anxiety and depression.   PHYSICAL EXAMINATION:  VITAL SIGNS: Temperature 98.3, pulse rate 104, respiratory rate 26, blood pressure 145/68, oxygen saturation when I was in the room was 81% to 83% on 2 liters.   GENERAL: The patient is an unkempt Caucasian female, not acutely distressed lying in bed.   HEENT: Normocephalic, atraumatic. Pupils are equal and reactive. Anicteric sclerae. Moist mucous membranes.   NECK: Supple. No JVD. No thyroid tenderness.   CARDIOVASCULAR: S1, S2, tachycardic. No murmurs, rubs, or gallops. There is a healing wound that has not fully closed at the base with about 1.5 cm area. No significant drainage. There is granulation tissue. Slight erythema around it.   LUNGS: Decreased breath sounds to about the mid basis. No crackles. Good air entry on the upper. No wheezing.   ABDOMEN: Soft, nontender, nondistended. Positive bowel sounds.   EXTREMITIES: 2+ edema and some chronic stasis changes in the skin.   NEUROLOGIC: Cranial nerves II through XII grossly intact.   PSYCH: Appears awake, alert, oriented x3, is anxious.   LABORATORY, DIAGNOSTIC AND RADIOLOGICAL DATA: BNP is 24,085, glucose 382, BUN 26, creatinine 1.93, sodium 137, potassium 4.5, albumin 2.8, alkaline phosphatase is elevated at 192, AST 36, ALT 22, troponin 0.08, CK-MB 1, CK total 102. WBC 7.5, hemoglobin 9.4, hematocrit 28.9. On 12/09 hemoglobin was 9.6 and hematocrit was 28.4, platelets 164. Rapid flu was negative. EKG: Sinus tachycardia. There is a right bundle branch block which appears to be new. I do not see any acute ST elevations or depressions. There are some nonspecific T wave abnormalities. X-ray of the chest showing pulmonary edema. Asymmetric edema  versus nonerythematous infiltrate within the lung basis and lingular regions. There is some discoid atelectasis as well.   ASSESSMENT AND PLAN: We have a 64 year old Caucasian female with coronary artery disease status post coronary artery bypass graft last month at [facility], obstructive sleep apnea, not on CPAP, chronic obstructive pulmonary disease, asthma, who is on 2 liters of oxygen with diabetes who presents with shortness of breath and acute on chronic respiratory failure, hypoxic in nature.   We will admit the patient to telemetry. She appears to have acute on chronic respiratory failure and hypoxic to 81% on oxygen 2 liters which is her outpatient rate. The likely cause of this respiratory failure is congestive heart failure. We do not have a known ejection fraction and the patient recently had a coronary artery bypass graft. She does not have a fever or leukocytosis. She also has a rapid fluid test which is negative. I doubt she has a pneumonia. She has evidence of congestive heart failure per examination and also BNP and x-ray. At this point, she was given 40 mg of IV Lasix here. She has an elevated creatinine. I would be more gentle with her Lasix for now given her acute kidney failure as well. I would give her 20 mg IV daily as well as resumption of her beta blocker and nitroglycerin. I would check a PA  and lateral x-ray of the chest in the a.m. I would resume her aspirin and statin. I would also check an echocardiogram and consult with cardiology. We will check daily weights and ins and outs would be measured. The patient also has chronic obstructive pulmonary disease and asthma and I would continue her inhalers. The patient does not appear to have significant wheezing currently. I would hold on steroids for now and we would monitor her status closely.   For acute renal failure, this could be in the setting of congestive heart failure which could improve with diuresis. We would monitor her GFR in the  morning. She also has elevated troponin and she has been having chest pains for two days. She recently was revascularized with a three-vessel coronary artery bypass graft. I would cycle the troponins and resume the aspirin, statin, beta blocker. This could be from demand ischemia from congestive heart failure and again, we would consult with cardiology tomorrow.   In regards to her high blood pressure, we would resume her beta blocker. In regards to her anxiety, we would resume her Xanax.   In regards to her diabetes, she appears to be hyperglycemic here with sugars in the 300s. I would resume the 10 units of insulin for now with sliding scale insulin and check A1c. The Lantus could be uptitrated based on her blood sugars here. Would resume her Crestor for her hyperlipidemia and check a lipid profile. The patient also does have anemia which appears to be somewhat chronic given her hemoglobin and hematocrit was lower in the last admission too. We will check guaiac stool as well as iron studies and vitamin B12 and folate levels. The patient has been complaining of diarrhea for several days. Would check Clostridium difficile and stool studies. Gastrointestinal losses from her diarrhea could have also potentially worsened her renal function.   CODE STATUS: FULL CODE.   TOTAL TIME SPENT: 50 minutes.   ____________________________ Krystal EatonShayiq Desean Heemstra, MD sa:ap D: 10/12/2011 18:02:56 ET T: 10/13/2011 07:33:43 ET JOB#: [id number]  cc: Krystal EatonShayiq Lanah Steines, MD, <Dictator> Meindert A. Lacie ScottsNiemeyer, MD Krystal EatonSHAYIQ Marguerite Jarboe MD ELECTRONICALLY SIGNED 10/21/2011 18:41

## 2015-01-14 NOTE — Consult Note (Signed)
Brief Consult Note: Diagnosis: 1. SOB in patient s/p recent CABG, pulmonary edema likely from CHF with elevated TNI.   Consult note dictated.   Recommend further assessment or treatment.   Orders entered.   Comments: Patient with CABG 09/11/12 presents with respiratory distress, pulmonary edema, elevated BNP likely CHF. She is lethargic, on BiPAP and poor historian. Slightly elevated TNI likely from demand ischemia (decreased renal fx/respiratory distress) as patient CP free and EKG with no acute ST/T wave abnormalities. Await echo results, Lasix given, continue beta-blocker, nitrates, replace electrolytes, monitor hgb/hct as anemia may be contributing to SOB. [facility].  Electronic Signatures: Radene KneeKhan, Shaukat Ali (MD)   (Signed 21-Jan-13 08:49)  Co-Signer: Brief Consult Note Verta EllenManzi, Jamelia Varano A (PA-C)   (Signed 21-Jan-13 08:36)  Authored: Brief Consult Note  Last Updated: 21-Jan-13 08:49 by Radene KneeKhan, Shaukat Ali (MD)
